# Patient Record
Sex: Female | Born: 1980 | Race: White | Hispanic: No | Marital: Single | Smoking: Never smoker
Health system: Southern US, Community
[De-identification: ages and names within clinical notes are randomized; demographics above are authoritative.]

## PROBLEM LIST (undated history)

## (undated) DIAGNOSIS — E7800 Pure hypercholesterolemia, unspecified: Secondary | ICD-10-CM

## (undated) DIAGNOSIS — F329 Major depressive disorder, single episode, unspecified: Secondary | ICD-10-CM

## (undated) DIAGNOSIS — R87629 Unspecified abnormal cytological findings in specimens from vagina: Secondary | ICD-10-CM

## (undated) DIAGNOSIS — B009 Herpesviral infection, unspecified: Secondary | ICD-10-CM

## (undated) DIAGNOSIS — F419 Anxiety disorder, unspecified: Secondary | ICD-10-CM

## (undated) DIAGNOSIS — I10 Essential (primary) hypertension: Secondary | ICD-10-CM

## (undated) HISTORY — DX: Anxiety disorder, unspecified: F41.9

## (undated) HISTORY — DX: Major depressive disorder, single episode, unspecified: F32.9

## (undated) HISTORY — DX: Unspecified abnormal cytological findings in specimens from vagina: R87.629

## (undated) HISTORY — PX: LEEP: SHX91

## (undated) HISTORY — DX: Herpesviral infection, unspecified: B00.9

## (undated) HISTORY — PX: WISDOM TOOTH EXTRACTION: SHX21

---

## 2001-05-09 ENCOUNTER — Encounter: Payer: Self-pay | Admitting: Family Medicine

## 2001-05-09 ENCOUNTER — Encounter: Admission: RE | Admit: 2001-05-09 | Discharge: 2001-05-09 | Payer: Self-pay | Admitting: Family Medicine

## 2001-05-16 ENCOUNTER — Other Ambulatory Visit: Admission: RE | Admit: 2001-05-16 | Discharge: 2001-05-16 | Payer: Self-pay | Admitting: Gynecology

## 2003-04-03 ENCOUNTER — Other Ambulatory Visit: Admission: RE | Admit: 2003-04-03 | Discharge: 2003-04-03 | Payer: Self-pay | Admitting: Gynecology

## 2020-01-04 ENCOUNTER — Other Ambulatory Visit: Payer: Self-pay

## 2020-01-04 ENCOUNTER — Encounter: Payer: Self-pay | Admitting: Emergency Medicine

## 2020-01-04 ENCOUNTER — Ambulatory Visit
Admission: EM | Admit: 2020-01-04 | Discharge: 2020-01-04 | Disposition: A | Discharge status: Home / Self Care | Payer: No Typology Code available for payment source | Attending: Emergency Medicine | Admitting: Emergency Medicine

## 2020-01-04 DIAGNOSIS — J029 Acute pharyngitis, unspecified: Secondary | ICD-10-CM | POA: Insufficient documentation

## 2020-01-04 DIAGNOSIS — J039 Acute tonsillitis, unspecified: Secondary | ICD-10-CM | POA: Insufficient documentation

## 2020-01-04 HISTORY — DX: Essential (primary) hypertension: I10

## 2020-01-04 LAB — POCT RAPID STREP A (OFFICE): Rapid Strep A Screen: NEGATIVE

## 2020-01-04 MED ORDER — CEPHALEXIN 500 MG PO CAPS: 500.0000 mg | ORAL_CAPSULE | Freq: Two times a day (BID) | ORAL | 0 refills | Status: AC

## 2020-01-04 NOTE — Discharge Instructions (Addendum)
Strep negative in office.  Culture sent.  We will call you with abnormal results.  Get rest and push fluids Keflex prescribed.  Take as directed and to completion Drink warm or cool liquids, use throat lozenges, or popsicles to help alleviate symptoms Take OTC ibuprofen or tylenol as needed for pain Follow up with PCP if symptoms persists Return or go to ER if patient has any new or worsening symptoms such as fever, chills, nausea, vomiting, worsening sore throat, cough, abdominal pain, chest pain, changes in bowel or bladder habits, etc..Marland Kitchen

## 2020-01-04 NOTE — ED Provider Notes (Signed)
[facility]   [id number] 01/04/20 Arrival Time: 1556  LE:XNTZ THROAT  SUBJECTIVE: History from: patient.  Carla Bailey is a 39 y.o. female who presents with sore throat and fatigue x 3 days.  Denies to sick exposure to strep, flu or mono, or precipitating event.  Has tried OTC medications without relief.  Symptoms are made worse with swallowing, but tolerating liquids and own secretions without difficulty.  Reports previous symptoms in the past that improved with antibiotic.   Denies fever, chills, ear pain, sinus pain, rhinorrhea, nasal congestion, cough, SOB, wheezing, chest pain, nausea, rash, changes in bowel or bladder habits.     ROS: As per HPI.  All other pertinent ROS negative.     Past Medical History:  Diagnosis Date  . Hypertension    History reviewed. No pertinent surgical history. No Known Allergies No current facility-administered medications on file prior to encounter.   Current Outpatient Medications on File Prior to Encounter  Medication Sig Dispense Refill  . amLODipine (NORVASC) 2.5 MG tablet Take 2.5 mg by mouth daily.    . hydrochlorothiazide (HYDRODIURIL) 50 MG tablet Take 50 mg by mouth daily.     Social History   Socioeconomic History  . Marital status: Single    Spouse name: Not on file  . Number of children: Not on file  . Years of education: Not on file  . Highest education level: Not on file  Occupational History  . Not on file  Tobacco Use  . Smoking status: Never Smoker  . Smokeless tobacco: Never Used  Substance and Sexual Activity  . Alcohol use: Yes    Comment: occasional  . Drug use: Not Currently  . Sexual activity: Not on file  Other Topics Concern  . Not on file  Social History Narrative  . Not on file   Social Determinants of Health   Financial Resource Strain:   . Difficulty of Paying Living Expenses: Not on file  Food Insecurity:   . Worried About Programme researcher, broadcasting/film/video in the Last Year: Not on file  . Ran  Out of Food in the Last Year: Not on file  Transportation Needs:   . Lack of Transportation (Medical): Not on file  . Lack of Transportation (Non-Medical): Not on file  Physical Activity:   . Days of Exercise per Week: Not on file  . Minutes of Exercise per Session: Not on file  Stress:   . Feeling of Stress : Not on file  Social Connections:   . Frequency of Communication with Friends and Family: Not on file  . Frequency of Social Gatherings with Friends and Family: Not on file  . Attends Religious Services: Not on file  . Active Member of Clubs or Organizations: Not on file  . Attends Banker Meetings: Not on file  . Marital Status: Not on file  Intimate Partner Violence:   . Fear of Current or Ex-Partner: Not on file  . Emotionally Abused: Not on file  . Physically Abused: Not on file  . Sexually Abused: Not on file   Family History  Problem Relation Age of Onset  . Hypertension Mother   . Stroke Mother   . Diabetes Father   . Hypertension Father     OBJECTIVE:  Vitals:   01/04/20 1617  BP: (!) 153/117  Pulse: (!) 107  Resp: 17  Temp: 98.7 F (37.1 C)  TempSrc: Oral  SpO2: 99%     General appearance: alert; appears  mildly fatigued, but nontoxic, speaking in full sentences and managing own secretions HEENT: NCAT; Ears: EACs clear, TMs pearly gray with visible cone of light, without erythema; Eyes: PERRL, EOMI grossly; Nose: no obvious rhinorrhea; Throat: oropharynx clear, tonsils 1+ and not erythematous with white tonsillar exudates, uvula midline Neck: supple without LAD Lungs: CTA bilaterally without adventitious breath sounds; cough absent Heart: regular rate and rhythm.   Skin: warm and dry Psychological: alert and cooperative; normal mood and affect  LABS: Results for orders placed or performed during the hospital encounter of 01/04/20 (from the past 24 hour(s))  POCT rapid strep A     Status: None   Collection Time: 01/04/20  4:26 PM  Result  Value Ref Range   Rapid Strep A Screen Negative Negative     ASSESSMENT & PLAN:  1. Acute tonsillitis, unspecified etiology   2. Sore throat     Meds ordered this encounter  Medications  . cephALEXin (KEFLEX) 500 MG capsule    Sig: Take 1 capsule (500 mg total) by mouth 2 (two) times daily for 10 days.    Dispense:  20 capsule    Refill:  0    Order Specific Question:   Supervising Provider    Answer:   Eustace Moore [[id number]]   Strep negative in office.  Culture sent.  We will call you with abnormal results.  Get rest and push fluids Keflex prescribed.  Take as directed and to completion Drink warm or cool liquids, use throat lozenges, or popsicles to help alleviate symptoms Take OTC ibuprofen or tylenol as needed for pain Follow up with PCP if symptoms persists Return or go to ER if patient has any new or worsening symptoms such as fever, chills, nausea, vomiting, worsening sore throat, cough, abdominal pain, chest pain, changes in bowel or bladder habits, etc...  Reviewed expectations re: course of current medical issues. Questions answered. Outlined signs and symptoms indicating need for more acute intervention. Patient verbalized understanding. After Visit Summary given.        Rennis Harding, PA-C 01/04/20 [phone]

## 2020-01-04 NOTE — ED Triage Notes (Signed)
Pt triaged and dc  by provider  

## 2020-01-08 LAB — CULTURE, GROUP A STREP (THRC)

## 2020-05-22 ENCOUNTER — Other Ambulatory Visit: Payer: Self-pay | Admitting: Nurse Practitioner

## 2020-08-14 ENCOUNTER — Other Ambulatory Visit: Payer: Self-pay | Admitting: Nurse Practitioner

## 2020-09-13 ENCOUNTER — Ambulatory Visit
Admission: EM | Admit: 2020-09-13 | Discharge: 2020-09-13 | Disposition: A | Discharge status: Home / Self Care | Payer: Commercial Managed Care - PPO | Attending: Emergency Medicine | Admitting: Emergency Medicine

## 2020-09-13 ENCOUNTER — Ambulatory Visit (INDEPENDENT_AMBULATORY_CARE_PROVIDER_SITE_OTHER): Payer: Commercial Managed Care - PPO

## 2020-09-13 ENCOUNTER — Other Ambulatory Visit: Payer: Self-pay

## 2020-09-13 DIAGNOSIS — R5383 Other fatigue: Secondary | ICD-10-CM | POA: Diagnosis not present

## 2020-09-13 DIAGNOSIS — R0981 Nasal congestion: Secondary | ICD-10-CM | POA: Diagnosis not present

## 2020-09-13 DIAGNOSIS — R52 Pain, unspecified: Secondary | ICD-10-CM

## 2020-09-13 DIAGNOSIS — J069 Acute upper respiratory infection, unspecified: Secondary | ICD-10-CM

## 2020-09-13 NOTE — Discharge Instructions (Signed)
Your EKG and chest xray look well today which is reassuring. I am checking your CBC as well to see if there are any concerning findings and to get a baseline white blood cell count.  Rest, increase your fluid intake.  If worsening- chest pain , shortness of breath , dizziness, or otherwise worsening please go to the ER for further evaluation. If you continue to have heart rate elevation with activity like this you will need further evaluation either with yoru PCP or with cardiology.

## 2020-09-13 NOTE — ED Triage Notes (Signed)
Pt presents with weakness and fatigue with nasal congestion for past week

## 2020-09-13 NOTE — ED Provider Notes (Signed)
[facility]    CSN: [id number] Arrival date & time: 09/13/20  0841      History   Chief Complaint Chief Complaint  Patient presents with  . Nasal Congestion    HPI Carla Bailey is a 40 y.o. female.   Carla Bailey presents with complaints of fatigue and congestion which started 8 days ago. She used mucinex all last week which did help some. Now with more fatigue, and she feels very diaphoretic with activity. No specific shortness of breath and infrequent cough. No calf or leg pain. No chest pain . No headache or body aches. She states she feels that her extremities feel "uncomfortable" and weak. 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 She did a home covid test day of onset of symptoms which was negative. Has not tested since. She has not had previous covid-19 infection. No gi symptoms. No known ill contacts.    ROS per HPI, negative if not otherwise mentioned.      Past Medical History:  Diagnosis Date  . Hypertension     There are no problems to display for this patient.   History reviewed. No pertinent surgical history.  OB History   No obstetric history on file.      Home Medications    Prior to Admission medications   Medication Sig Start Date End Date Taking? Authorizing Provider  amLODipine (NORVASC) 2.5 MG tablet Take 2.5 mg by mouth daily.    [provider]  hydrochlorothiazide (HYDRODIURIL) 50 MG tablet Take 50 mg by mouth daily.    [provider]  NUVARING 0.12-0.015 MG/24HR vaginal ring INSERT 1 RING VAGINALLY FOR 3 WEEKS THEN REMOVE FOR 1 WEEK, START ON OR BEFORE DAY 5 OF CYCLE 05/24/20   Heather Roberts, NP    Family History Family History  Problem Relation Age of Onset  . Hypertension Mother   . Stroke Mother   . Diabetes Father   . Hypertension Father     Social History Social History    Tobacco Use  . Smoking status: Never Smoker  . Smokeless tobacco: Never Used  Substance Use Topics  . Alcohol use: Yes    Comment: occasional  . Drug use: Not Currently     Allergies   Patient has no known allergies.   Review of Systems Review of Systems   Physical Exam Triage Vital Signs ED Triage Vitals  Enc Vitals Group     BP 09/13/20 0916 129/83     Pulse Rate 09/13/20 0916 (!) 122     Resp 09/13/20 0916 20     Temp 09/13/20 0916 97.9 F (36.6 C)     Temp src --      SpO2 09/13/20 0916 97 %     Weight --      Height --      Head Circumference --      Peak Flow --      Pain Score 09/13/20 0917 0     Pain Loc --      Pain Edu? --      Excl. in GC? --    No data found.  Updated Vital Signs BP 129/83   Pulse (!) 122   Temp 97.9 F (36.6 C)   Resp 20   LMP 08/16/2020   SpO2 97%   Visual Acuity Right Eye Distance:  Left Eye Distance:   Bilateral Distance:    Right Eye Near:   Left Eye Near:    Bilateral Near:     Physical Exam Constitutional:      General: She is not in acute distress.    Appearance: She is well-developed.  HENT:     Head: Normocephalic and atraumatic.     Right Ear: Tympanic membrane, ear canal and external ear normal.     Left Ear: Tympanic membrane, ear canal and external ear normal.     Nose: Nose normal.     Mouth/Throat:     Pharynx: Uvula midline.     Tonsils: No tonsillar exudate.  Eyes:     Conjunctiva/sclera: Conjunctivae normal.     Pupils: Pupils are equal, round, and reactive to light.  Cardiovascular:     Rate and Rhythm: Normal rate and regular rhythm.     Heart sounds: Normal heart sounds.  Pulmonary:     Effort: Pulmonary effort is normal.     Breath sounds: Normal breath sounds.  Skin:    General: Skin is warm and dry.  Neurological:     Mental Status: She is alert and oriented to person, place, and time.    EKG:  NSR rate of 76 . Previous EKG was not available for review. No stwave changes as  interpreted by me.    UC Treatments / Results  Labs (all labs ordered are listed, but only abnormal results are displayed) Labs Reviewed  CBC WITH DIFFERENTIAL/PLATELET    EKG   Radiology DG Chest 2 View  Result Date: 09/13/2020 CLINICAL DATA:  Fatiguing and congestion with body aches EXAM: CHEST - 2 VIEW COMPARISON:  None. FINDINGS: Normal heart size and mediastinal contours. No acute infiltrate or edema. No effusion or pneumothorax. No acute osseous findings. IMPRESSION: Negative chest. Electronically Signed   By: Marnee Spring M.D.   On: 09/13/2020 10:14    Procedures Procedures (including critical care time)  Medications Ordered in UC Medications - No data to display  Initial Impression / Assessment and Plan / UC Course  I have reviewed the triage vital signs and the nursing notes.  Pertinent labs & imaging results that were available during my care of the patient were reviewed by me and considered in my medical decision making (see chart for details).     Tachycardia initially with complaints of diaphoresis with activity. No chest pain. Tachycardia improves with rest. No hypoxia. URI symptoms. No obvious dehydration. Cbc collected and pending. Non toxic appearing and chest xray stable here today. pe considered. Again, no pain, no shortness of breath , no hypoxia and tachycardia does not persist. Continue with supportive cares. Return precautions provided. Patient verbalized understanding and agreeable to plan.   Final Clinical Impressions(s) / UC Diagnoses   Final diagnoses:  Acute upper respiratory infection  Fatigue, unspecified type     Discharge Instructions     Your EKG and chest xray look well today which is reassuring. I am checking your CBC as well to see if there are any concerning findings and to get a baseline white blood cell count.  Rest, increase your fluid intake.  If worsening- chest pain , shortness of breath , dizziness, or otherwise worsening  please go to the ER for further evaluation. If you continue to have heart rate elevation with activity like this you will need further evaluation either with yoru PCP or with cardiology.     ED Prescriptions    None  PDMP not reviewed this encounter.   Georgetta Haber, NP 09/13/20 [phone]

## 2020-09-14 LAB — CBC WITH DIFFERENTIAL/PLATELET
Basophils Absolute: 0.1 10*3/uL (ref 0.0–0.2)
Basos: 0 %
EOS (ABSOLUTE): 0.1 10*3/uL (ref 0.0–0.4)
Eos: 1 %
Hematocrit: 44.8 % (ref 34.0–46.6)
Hemoglobin: 14.6 g/dL (ref 11.1–15.9)
Immature Grans (Abs): 0 10*3/uL (ref 0.0–0.1)
Immature Granulocytes: 0 %
Lymphocytes Absolute: 3.1 10*3/uL (ref 0.7–3.1)
Lymphs: 21 %
MCH: 29.9 pg (ref 26.6–33.0)
MCHC: 32.6 g/dL (ref 31.5–35.7)
MCV: 92 fL (ref 79–97)
Monocytes Absolute: 0.9 10*3/uL (ref 0.1–0.9)
Monocytes: 6 %
Neutrophils Absolute: 10.9 10*3/uL — ABNORMAL HIGH (ref 1.4–7.0)
Neutrophils: 72 %
Platelets: 397 10*3/uL (ref 150–450)
RBC: 4.89 x10E6/uL (ref 3.77–5.28)
RDW: 11.8 % (ref 11.7–15.4)
WBC: 15.1 10*3/uL — ABNORMAL HIGH (ref 3.4–10.8)

## 2021-07-07 ENCOUNTER — Ambulatory Visit
Admission: EM | Admit: 2021-07-07 | Discharge: 2021-07-07 | Disposition: A | Discharge status: Home / Self Care | Payer: Commercial Managed Care - PPO

## 2021-07-07 ENCOUNTER — Other Ambulatory Visit: Payer: Self-pay

## 2021-07-07 DIAGNOSIS — R070 Pain in throat: Secondary | ICD-10-CM

## 2021-07-07 DIAGNOSIS — J309 Allergic rhinitis, unspecified: Secondary | ICD-10-CM

## 2021-07-07 DIAGNOSIS — R051 Acute cough: Secondary | ICD-10-CM

## 2021-07-07 DIAGNOSIS — H9203 Otalgia, bilateral: Secondary | ICD-10-CM | POA: Diagnosis not present

## 2021-07-07 DIAGNOSIS — J019 Acute sinusitis, unspecified: Secondary | ICD-10-CM | POA: Diagnosis not present

## 2021-07-07 DIAGNOSIS — R0981 Nasal congestion: Secondary | ICD-10-CM

## 2021-07-07 MED ORDER — BENZONATATE 100 MG PO CAPS: 100.0000 mg | ORAL_CAPSULE | Freq: Three times a day (TID) | ORAL | 0 refills | Status: DC | PRN

## 2021-07-07 MED ORDER — PROMETHAZINE-DM 6.25-15 MG/5ML PO SYRP: 5.0000 mL | ORAL_SOLUTION | Freq: Every evening | ORAL | 0 refills | Status: DC | PRN

## 2021-07-07 MED ORDER — LEVOCETIRIZINE DIHYDROCHLORIDE 5 MG PO TABS: 5.0000 mg | ORAL_TABLET | Freq: Every evening | ORAL | 0 refills | Status: DC

## 2021-07-07 MED ORDER — AMOXICILLIN-POT CLAVULANATE 875-125 MG PO TABS: 1.0000 | ORAL_TABLET | Freq: Two times a day (BID) | ORAL | 0 refills | Status: DC

## 2021-07-07 MED ORDER — PSEUDOEPHEDRINE HCL 60 MG PO TABS: 60.0000 mg | ORAL_TABLET | Freq: Three times a day (TID) | ORAL | 0 refills | Status: DC | PRN

## 2021-07-07 NOTE — ED Provider Notes (Signed)
?[facility] ? ? ?MRN: [id number] DOB: 08-Feb-1981 ? ?Subjective:  ? ?Carla Bailey is a 41 y.o. female presenting for 5-day history of acute onset persistent and worsening sinus congestion, sinus pressure, hoarseness, bilateral ear pain, coughing, throat congestion, chest congestion, shortness of breath from the coughing.  No history of asthma.  Patient is not a smoker.  She is done 2 COVID test and has been negative.  She believes she had flu 2 weeks ago and had a recovery prior to getting ill this past week. ? ?No current facility-administered medications for this encounter. ? ?Current Outpatient Medications:  ?  acyclovir (ZOVIRAX) 800 MG tablet, Take 800 mg by mouth daily., Disp: , Rfl:  ?  amLODipine (NORVASC) 2.5 MG tablet, Take 2.5 mg by mouth daily., Disp: , Rfl:  ?  hydrochlorothiazide (HYDRODIURIL) 50 MG tablet, Take 50 mg by mouth daily., Disp: , Rfl:  ?  NUVARING 0.12-0.015 MG/24HR vaginal ring, INSERT 1 RING VAGINALLY FOR 3 WEEKS THEN REMOVE FOR 1 WEEK, START ON OR BEFORE DAY 5 OF CYCLE, Disp: 1 each, Rfl: 1  ? ?No Known Allergies ? ?Past Medical History:  ?Diagnosis Date  ? Hypertension   ?  ? ?History reviewed. No pertinent surgical history. ? ?Family History  ?Problem Relation Age of Onset  ? Hypertension Mother   ? Stroke Mother   ? Diabetes Father   ? Hypertension Father   ? ? ?Social History  ? ?Tobacco Use  ? Smoking status: Never  ? Smokeless tobacco: Never  ?Substance Use Topics  ? Alcohol use: Yes  ?  Comment: occasional  ? Drug use: Not Currently  ? ? ?ROS ? ? ?Objective:  ? ?Vitals: ?BP 131/86   Pulse 93   Temp 97.7 ?F (36.5 ?C)   Resp 18   SpO2 96%  ? ?Physical Exam ?Constitutional:   ?   General: She is not in acute distress. ?   Appearance: Normal appearance. She is well-developed and normal weight. She is not ill-appearing, toxic-appearing or diaphoretic.  ?HENT:  ?   Head: Normocephalic and atraumatic.  ?   Right Ear: Ear canal and external ear normal. No  drainage or tenderness. No middle ear effusion. There is no impacted cerumen. Tympanic membrane is not erythematous.  ?   Left Ear: Ear canal and external ear normal. No drainage or tenderness.  No middle ear effusion. There is no impacted cerumen. Tympanic membrane is not erythematous.  ?   Ears:  ?   Comments: TMs opacified bilaterally. ?   Nose: Congestion and rhinorrhea present.  ?   Mouth/Throat:  ?   Mouth: Mucous membranes are moist. No oral lesions.  ?   Pharynx: No pharyngeal swelling, oropharyngeal exudate, posterior oropharyngeal erythema or uvula swelling.  ?   Tonsils: No tonsillar exudate or tonsillar abscesses.  ?   Comments: Hoarseness of voice noted. ?Eyes:  ?   General: No scleral icterus.    ?   Right eye: No discharge.     ?   Left eye: No discharge.  ?   Extraocular Movements: Extraocular movements intact.  ?   Right eye: Normal extraocular motion.  ?   Left eye: Normal extraocular motion.  ?   Conjunctiva/sclera: Conjunctivae normal.  ?Cardiovascular:  ?   Rate and Rhythm: Normal rate.  ?   Heart sounds: No murmur heard. ?  No friction rub. No gallop.  ?Pulmonary:  ?   Effort: Pulmonary effort is normal. No respiratory distress.  ?  Breath sounds: No stridor. No wheezing, rhonchi or rales.  ?Chest:  ?   Chest wall: No tenderness.  ?Musculoskeletal:  ?   Cervical back: Normal range of motion and neck supple.  ?Lymphadenopathy:  ?   Cervical: No cervical adenopathy.  ?Skin: ?   General: Skin is warm and dry.  ?Neurological:  ?   General: No focal deficit present.  ?   Mental Status: She is alert and oriented to person, place, and time.  ?Psychiatric:     ?   Mood and Affect: Mood normal.     ?   Behavior: Behavior normal.  ? ? ?Assessment and Plan :  ? ?PDMP not reviewed this encounter. ? ?1. Acute non-recurrent sinusitis, unspecified location   ?2. Sinus congestion   ?3. Acute cough   ?4. Acute otalgia, bilateral   ?5. Throat pain   ? ?Given timeline of illness, deferred COVID testing. Deferred  imaging given clear cardiopulmonary exam, hemodynamically stable vital signs. Will start empiric treatment for sinusitis with Augmentin.  Recommended supportive care otherwise including the use of oral antihistamine, decongestant especially since she has allergies. Counseled patient on potential for adverse effects with medications prescribed/recommended today, ER and return-to-clinic precautions discussed, patient verbalized understanding. ? ?  ?Wallis Bamberg, PA-C ?07/07/21 1007 ? ?

## 2021-07-07 NOTE — ED Triage Notes (Signed)
Pt presents with cough for past week and also has bilateral ear pain  ?

## 2021-09-10 ENCOUNTER — Encounter: Payer: Self-pay | Admitting: Emergency Medicine

## 2021-09-10 ENCOUNTER — Ambulatory Visit (INDEPENDENT_AMBULATORY_CARE_PROVIDER_SITE_OTHER): Payer: Commercial Managed Care - PPO

## 2021-09-10 ENCOUNTER — Ambulatory Visit
Admission: EM | Admit: 2021-09-10 | Discharge: 2021-09-10 | Disposition: A | Discharge status: Home / Self Care | Payer: Commercial Managed Care - PPO

## 2021-09-10 DIAGNOSIS — M25571 Pain in right ankle and joints of right foot: Secondary | ICD-10-CM | POA: Diagnosis not present

## 2021-09-10 HISTORY — DX: Pure hypercholesterolemia, unspecified: E78.00

## 2021-09-10 NOTE — ED Provider Notes (Signed)
?[facility] ? ? ? ?CSN: [id number] ?Arrival date & time: 09/10/21  1405 ? ? ?  ? ?History   ?Chief Complaint ?No chief complaint on file. ? ?HPI ?Carla Bailey is a 41 y.o. female.  ? ?Presenting today with 1 day history of right lateral ankle pain.  She states she was sitting with her leg underneath her in a chair this morning and something that was a sharp within the chair stabbed her in the lateral ankle.  She states the pain was so severe that it caused her to feel like she was going to pass out.  She took 2 Advil and states she has been feeling a lot better since then but still having some soreness in the area, worse with movement.  Denies swelling, bruising, bleeding, numbness, tingling, weakness. ? ?Past Medical History:  ?Diagnosis Date  ? High cholesterol   ? Hypertension   ? ? ?There are no problems to display for this patient. ? ? ?History reviewed. No pertinent surgical history. ? ?OB History   ?No obstetric history on file. ?  ? ? ? ?Home Medications   ? ?Prior to Admission medications   ?Medication Sig Start Date End Date Taking? Authorizing Provider  ?atorvastatin (LIPITOR) 10 MG tablet Take 10 mg by mouth daily.   Yes [provider]  ?escitalopram (LEXAPRO) 10 MG tablet Take 10 mg by mouth daily.   Yes [provider]  ?eszopiclone (LUNESTA) 1 MG TABS tablet Take 1 mg by mouth at bedtime as needed for sleep. Take immediately before bedtime   Yes [provider]  ?acyclovir (ZOVIRAX) 800 MG tablet Take 800 mg by mouth daily. 04/06/21   [provider]  ?amLODipine (NORVASC) 2.5 MG tablet Take 2.5 mg by mouth daily.    [provider]  ?amoxicillin-clavulanate (AUGMENTIN) 875-125 MG tablet Take 1 tablet by mouth 2 (two) times daily. 07/07/21   Wallis BambergMani, Mario, PA-C  ?benzonatate (TESSALON) 100 MG capsule Take 1-2 capsules (100-200 mg total) by mouth 3 (three) times daily as needed for cough. 07/07/21   Wallis BambergMani, Mario, PA-C  ?hydrochlorothiazide  (HYDRODIURIL) 50 MG tablet Take 50 mg by mouth daily.    [provider]  ?levocetirizine (XYZAL) 5 MG tablet Take 1 tablet (5 mg total) by mouth every evening. 07/07/21   Wallis BambergMani, Mario, PA-C  ?NUVARING 0.12-0.015 MG/24HR vaginal ring INSERT 1 RING VAGINALLY FOR 3 WEEKS THEN REMOVE FOR 1 WEEK, START ON OR BEFORE DAY 5 OF CYCLE 05/24/20   Heather RobertsGray, Joseph M, NP  ?promethazine-dextromethorphan (PROMETHAZINE-DM) 6.25-15 MG/5ML syrup Take 5 mLs by mouth at bedtime as needed for cough. 07/07/21   Wallis BambergMani, Mario, PA-C  ?pseudoephedrine (SUDAFED) 60 MG tablet Take 1 tablet (60 mg total) by mouth every 8 (eight) hours as needed for congestion. 07/07/21   Wallis BambergMani, Mario, PA-C  ? ?Family History ?Family History  ?Problem Relation Age of Onset  ? Hypertension Mother   ? Stroke Mother   ? Diabetes Father   ? Hypertension Father   ? ?Social History ?Social History  ? ?Tobacco Use  ? Smoking status: Never  ? Smokeless tobacco: Never  ?Substance Use Topics  ? Alcohol use: Yes  ?  Comment: occasional  ? Drug use: Not Currently  ? ? ? ?Allergies   ?Patient has no known allergies. ? ? ?Review of Systems ?Review of Systems ?Per HPI ? ?Physical Exam ?Triage Vital Signs ?ED Triage Vitals  ?Enc Vitals Group  ?   BP 09/10/21 1412 134/80  ?  Pulse Rate 09/10/21 1412 (!) 108  ?   Resp 09/10/21 1412 18  ?   Temp 09/10/21 1412 98 ?F (36.7 ?C)  ?   Temp Source 09/10/21 1412 Oral  ?   SpO2 09/10/21 1412 95 %  ?   Weight --   ?   Height --   ?   Head Circumference --   ?   Peak Flow --   ?   Pain Score 09/10/21 1414 4  ?   Pain Loc --   ?   Pain Edu? --   ?   Excl. in GC? --   ? ?No data found. ? ?Updated Vital Signs ?BP 134/80 (BP Location: Right Arm)   Pulse (!) 108   Temp 98 ?F (36.7 ?C) (Oral)   Resp 18   SpO2 95%  ? ?Visual Acuity ?Right Eye Distance:   ?Left Eye Distance:   ?Bilateral Distance:   ? ?Right Eye Near:   ?Left Eye Near:    ?Bilateral Near:    ? ?Physical Exam ?Vitals and nursing note reviewed.  ?Constitutional:   ?   Appearance:  Normal appearance. She is not ill-appearing.  ?HENT:  ?   Head: Atraumatic.  ?Eyes:  ?   Extraocular Movements: Extraocular movements intact.  ?   Conjunctiva/sclera: Conjunctivae normal.  ?Cardiovascular:  ?   Rate and Rhythm: Normal rate and regular rhythm.  ?   Heart sounds: Normal heart sounds.  ?Pulmonary:  ?   Effort: Pulmonary effort is normal.  ?   Breath sounds: Normal breath sounds.  ?Musculoskeletal:     ?   General: Tenderness and signs of injury present. No swelling or deformity. Normal range of motion.  ?   Cervical back: Normal range of motion and neck supple.  ?   Comments: Tenderness to palpation right lateral malleolus, range of motion fully intact  ?Skin: ?   General: Skin is warm and dry.  ?   Comments: Very small area of erythema, pinpoint superficial marking from object in chair that poked her.  No diffuse edema, bruising  ?Neurological:  ?   Mental Status: She is alert and oriented to person, place, and time.  ?   Comments: Right lower extremity neurovascularly intact  ?Psychiatric:     ?   Mood and Affect: Mood normal.     ?   Thought Content: Thought content normal.     ?   Judgment: Judgment normal.  ? ? ? ?UC Treatments / Results  ?Labs ?(all labs ordered are listed, but only abnormal results are displayed) ?Labs Reviewed - No data to display ? ?EKG ? ? ?Radiology ?DG Ankle Complete Right ? ?Result Date: 09/10/2021 ?CLINICAL DATA:  Larey Seat.  Right ankle pain and swelling. EXAM: RIGHT ANKLE - COMPLETE 3+ VIEW COMPARISON:  None Available. FINDINGS: The ankle mortise is maintained. No acute ankle fracture. No osteochondral lesion. No ankle joint effusion. The mid and hindfoot bony structures are intact. IMPRESSION: No acute bony findings. Electronically Signed   By: Rudie Meyer M.D.   On: 09/10/2021 14:34   ? ?Procedures ?Procedures (including critical care time) ? ?Medications Ordered in UC ?Medications - No data to display ? ?Initial Impression / Assessment and Plan / UC Course  ?I have  reviewed the triage vital signs and the nursing notes. ? ?Pertinent labs & imaging results that were available during my care of the patient were reviewed by me and considered in my medical decision making (see chart  for details). ? ?  ? ?X-ray negative for acute bony abnormality.  Ace wrap applied for comfort, RICE protocol, over-the-counter pain relievers reviewed.  Return for worsening symptoms.  Work note given. ? ?Final Clinical Impressions(s) / UC Diagnoses  ? ?Final diagnoses:  ?Pain in joint involving right ankle and foot  ? ?Discharge Instructions   ?None ?  ? ?ED Prescriptions   ?None ?  ? ?PDMP not reviewed this encounter. ?  ?Particia Nearing, PA-C ?09/10/21 1505 ? ?

## 2021-09-10 NOTE — ED Triage Notes (Signed)
Fell in chair on right ankle, pain and swelling to right ankle ?

## 2021-11-28 IMAGING — DX DG CHEST 2V
2 series · 2 of 2 positions shown · non-contrast
Comparison: None.

CLINICAL DATA: Fatiguing and congestion with body aches

EXAM:
CHEST - 2 VIEW

[chest pa]
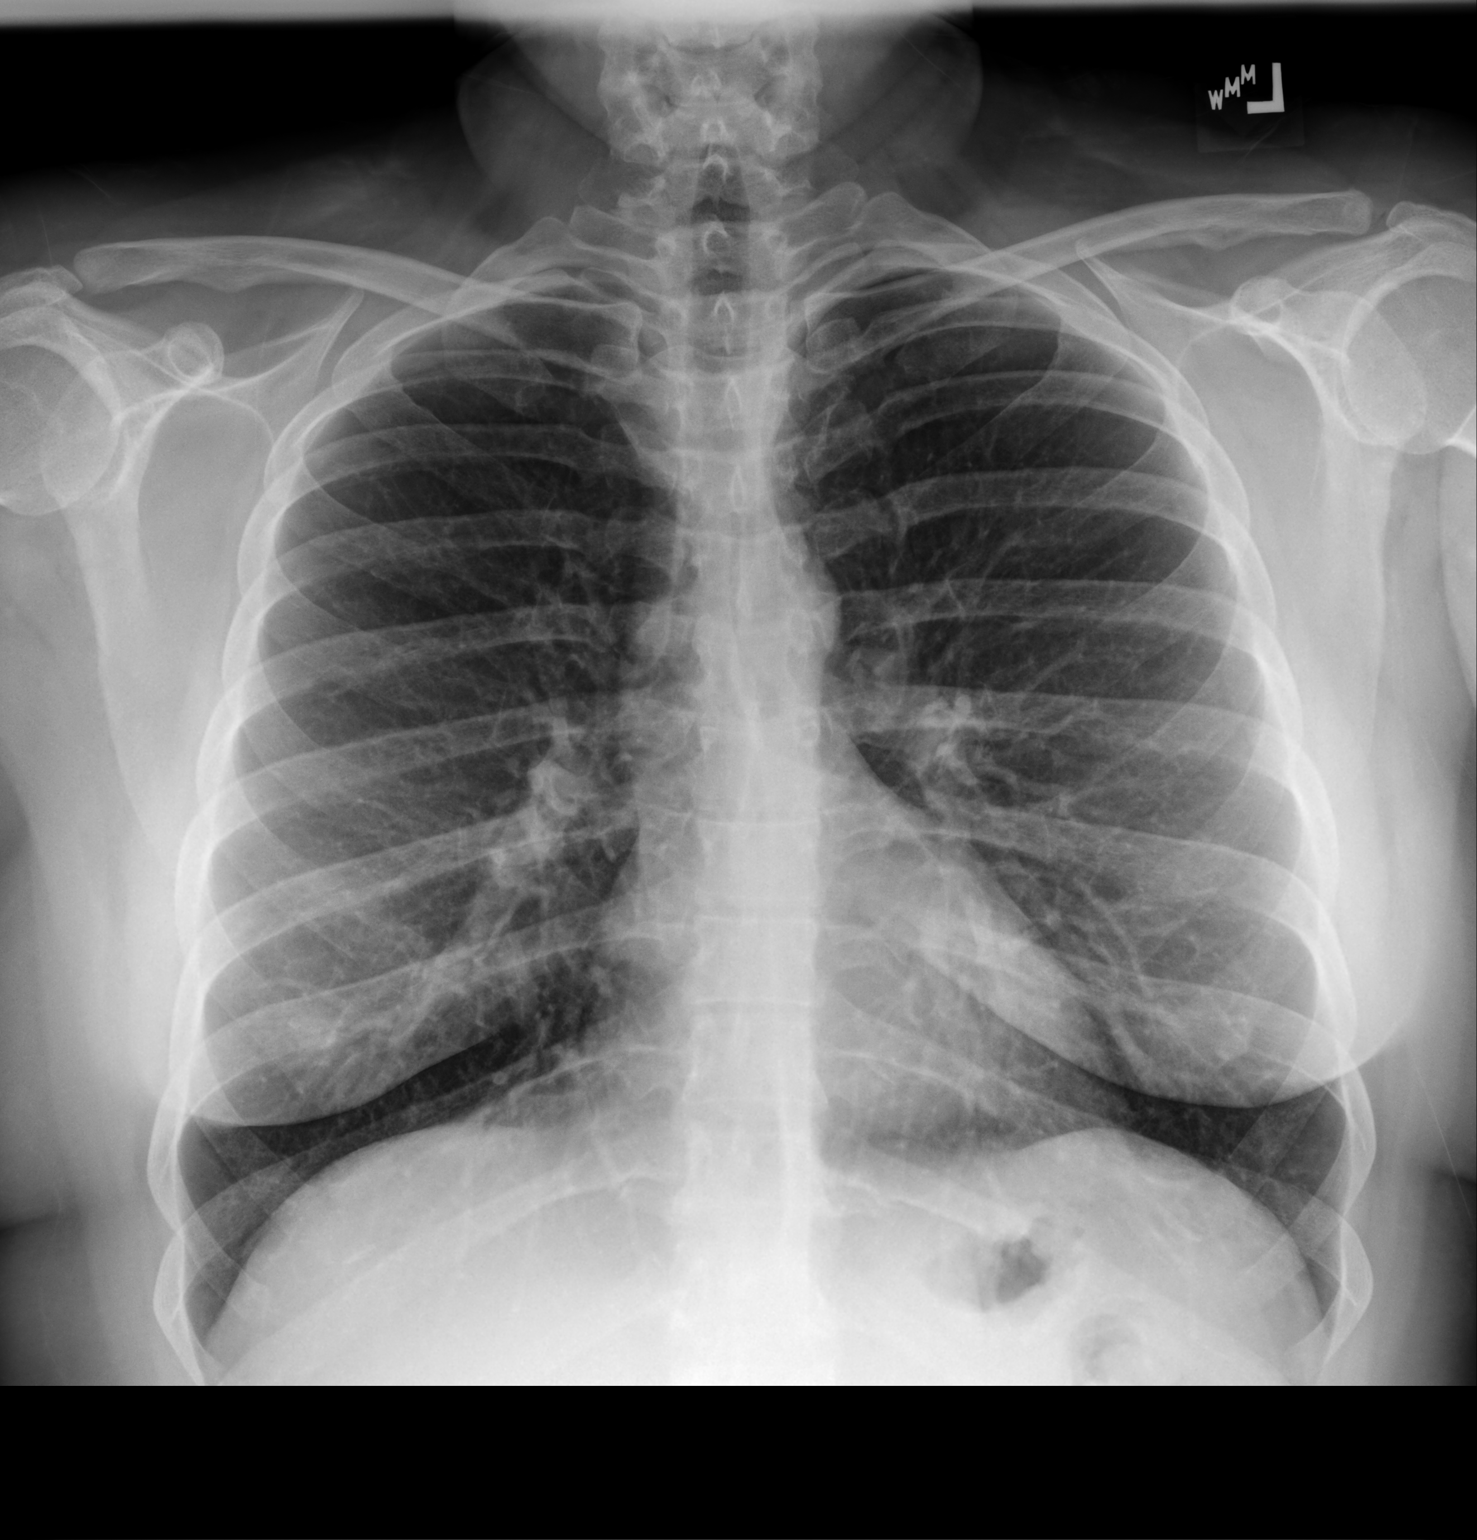

[chest lat]
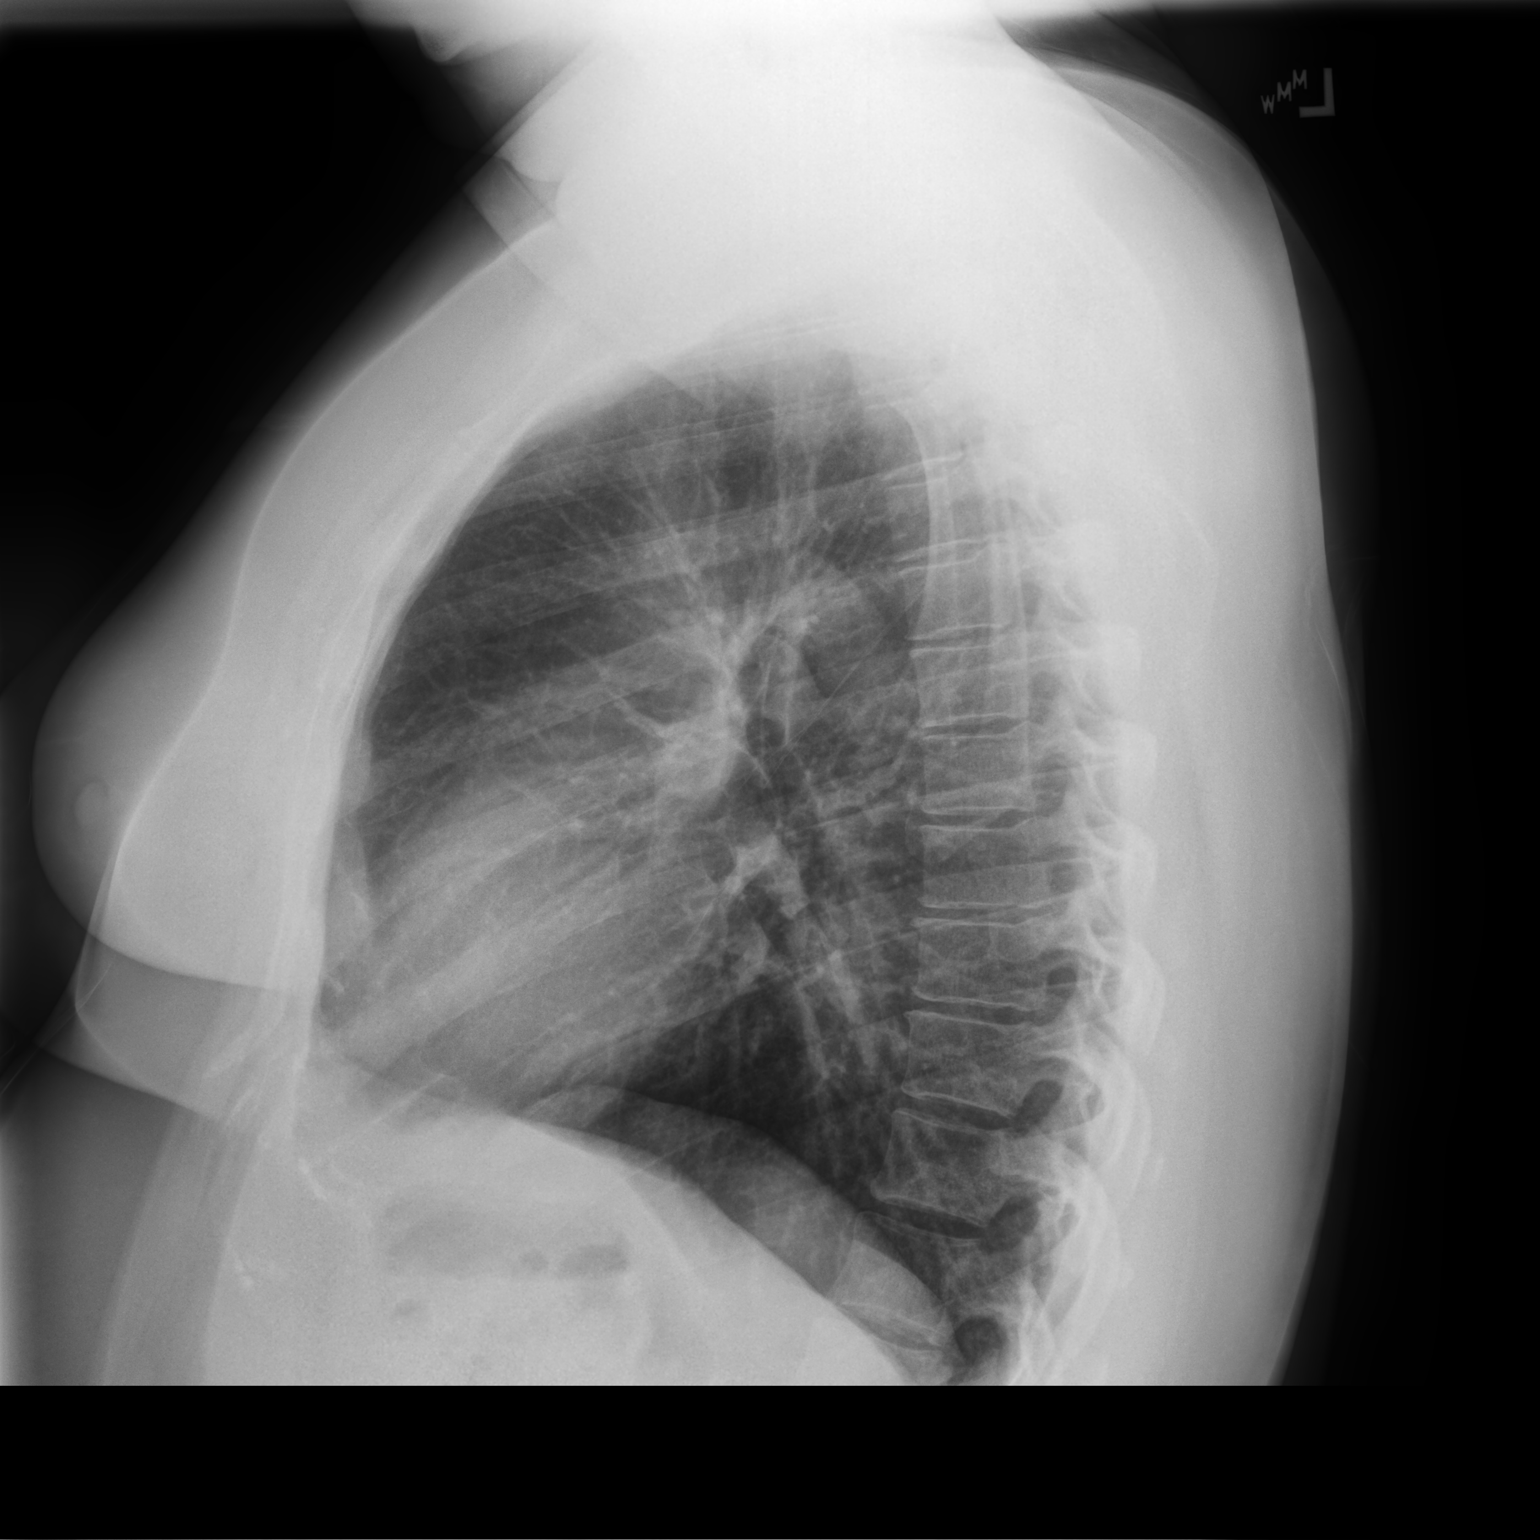

[2 of 2 positions shown; findings below may reference images not displayed]

FINDINGS: Normal heart size and mediastinal contours. No acute infiltrate or
edema. No effusion or pneumothorax. No acute osseous findings.
IMPRESSION: Negative chest.

## 2022-05-10 ENCOUNTER — Other Ambulatory Visit (HOSPITAL_COMMUNITY): Payer: Self-pay | Admitting: Internal Medicine

## 2022-05-10 DIAGNOSIS — Z1231 Encounter for screening mammogram for malignant neoplasm of breast: Secondary | ICD-10-CM

## 2022-05-26 ENCOUNTER — Encounter (HOSPITAL_COMMUNITY): Payer: Self-pay

## 2022-05-26 ENCOUNTER — Ambulatory Visit (HOSPITAL_COMMUNITY)
Admission: RE | Admit: 2022-05-26 | Discharge: 2022-05-26 | Disposition: A | Discharge status: Home / Self Care | Payer: Commercial Managed Care - PPO | Source: Ambulatory Visit | Attending: Internal Medicine | Admitting: Internal Medicine

## 2022-05-26 DIAGNOSIS — Z1231 Encounter for screening mammogram for malignant neoplasm of breast: Secondary | ICD-10-CM

## 2022-11-07 ENCOUNTER — Ambulatory Visit: Payer: Commercial Managed Care - PPO | Admitting: Obstetrics & Gynecology

## 2022-11-07 ENCOUNTER — Encounter: Payer: Self-pay | Admitting: Obstetrics & Gynecology

## 2022-11-07 VITALS — Ht 64.0 in

## 2022-11-07 DIAGNOSIS — N870 Mild cervical dysplasia: Secondary | ICD-10-CM

## 2022-11-07 MED ORDER — VEOZAH 45 MG PO TABS: 1.0000 | ORAL_TABLET | Freq: Every day | ORAL | 11 refills | Status: DC

## 2022-11-07 NOTE — Progress Notes (Signed)
    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 New sexual partner:  No.    History of abnormal Pap: yes years and years ago  Procedure Details  The risks and benefits of the procedure and Written informed consent obtained.  Speculum placed in vagina and excellent visualization of cervix achieved, cervix swabbed x 3 with acetic acid solution.  Findings: Adequate colposcopy is noted today.  Cervix: no visible lesions, no mosaicism, no punctation, and no abnormal vasculature; SCJ visualized 360 degrees without lesions and no biopsies taken. Vaginal inspection: normal without visible lesions. Vulvar colposcopy: vulvar colposcopy not performed.  Specimens: none  Complications: none.  Colposcopic Impression: Normal colposcopy  Plan(Based on 2019 ASCCP recommendations) Repeat HPV based cytology 1 year

## 2023-05-23 ENCOUNTER — Other Ambulatory Visit (HOSPITAL_COMMUNITY): Payer: Self-pay | Admitting: Internal Medicine

## 2023-05-23 DIAGNOSIS — Z1231 Encounter for screening mammogram for malignant neoplasm of breast: Secondary | ICD-10-CM

## 2023-06-08 ENCOUNTER — Ambulatory Visit (HOSPITAL_COMMUNITY)
Admission: RE | Admit: 2023-06-08 | Discharge: 2023-06-08 | Disposition: A | Discharge status: Home / Self Care | Payer: PRIVATE HEALTH INSURANCE | Source: Ambulatory Visit | Attending: Internal Medicine | Admitting: Internal Medicine

## 2023-06-08 DIAGNOSIS — Z1231 Encounter for screening mammogram for malignant neoplasm of breast: Secondary | ICD-10-CM | POA: Insufficient documentation

## 2024-01-19 LAB — CYTOLOGY - PAP

## 2024-02-08 ENCOUNTER — Telehealth: Payer: Self-pay | Admitting: *Deleted

## 2024-02-08 NOTE — Telephone Encounter (Signed)
 Made patient aware that we have contacted [facility] to add subtypes 16/18/45 to her pap to determine if she needs colpo or repeat pap in 1 year. Pt verbalized understanding with no further questions.

## 2024-05-08 ENCOUNTER — Encounter: Payer: Self-pay | Admitting: Emergency Medicine

## 2024-05-08 ENCOUNTER — Ambulatory Visit
Admission: EM | Admit: 2024-05-08 | Discharge: 2024-05-08 | Disposition: A | Discharge status: Home / Self Care | Attending: Nurse Practitioner | Admitting: Nurse Practitioner

## 2024-05-08 DIAGNOSIS — H9202 Otalgia, left ear: Secondary | ICD-10-CM | POA: Insufficient documentation

## 2024-05-08 DIAGNOSIS — J029 Acute pharyngitis, unspecified: Secondary | ICD-10-CM | POA: Diagnosis present

## 2024-05-08 LAB — POCT RAPID STREP A (OFFICE): Rapid Strep A Screen: NEGATIVE

## 2024-05-08 NOTE — Discharge Instructions (Signed)
 The rapid strep test was negative.  A throat culture is pending.  You will be contacted if the pending test result is abnormal.  You will also have access to the results via MyChart. Increase fluids and allow for plenty of rest. You may take over-the-counter Tylenol or ibuprofen as needed for pain, fever, or general discomfort. You may also use warm salt water gargles 3-4 times daily as needed or Chloraseptic throat spray or throat lozenges while symptoms persist. Also recommend the use of over-the-counter antihistamine and nasal spray to help with postnasal drainage such as Zyrtec, Claritin, or Allegra.  You may also purchase over-the-counter Flonase nasal spray as needed. Continue to monitor for worsening symptoms.  Follow-up if you develop fever, chills, or other upper respiratory symptoms. Follow-up as needed.

## 2024-05-08 NOTE — ED Provider Notes (Signed)
 " [facility]    CSN: [id number] Arrival date & time: 05/08/24  1740      History   Chief Complaint No chief complaint on file.   HPI Carla Bailey is a 44 y.o. female.   The history is provided by the patient.   Patient presents for a 3-day history of left ear pain, and sore throat.  She denies fever, chills, headache, sore throat, ear drainage, abdominal pain, nausea, vomiting, diarrhea, or rash.  Patient also states that she feels a heaviness in her chest.  States that she has not had a cough.  States that she would like to be evaluated to see if she is coming down with anything.  So far, she has not taken any medications for her symptoms.  Past Medical History:  Diagnosis Date   Anxiety    Herpes simplex virus (HSV) infection    High cholesterol    Hypertension    Major depressive disorder    Vaginal Pap smear, abnormal     There are no active problems to display for this patient.   Past Surgical History:  Procedure Laterality Date   LEEP     WISDOM TOOTH EXTRACTION      OB History     Gravida  0   Para  0   Term  0   Preterm  0   AB  0   Living  0      SAB  0   IAB  0   Ectopic  0   Multiple  0   Live Births  0            Home Medications    Prior to Admission medications  Medication Sig Start Date End Date Taking? Authorizing Provider  propranolol (INDERAL) 10 MG tablet Take 10 mg by mouth as needed.   Yes [provider]  acyclovir (ZOVIRAX) 800 MG tablet Take 800 mg by mouth daily. 04/06/21   [provider]  amLODipine (NORVASC) 5 MG tablet Take 1 tablet by mouth daily. 02/15/22   [provider]  atorvastatin (LIPITOR) 10 MG tablet Take 10 mg by mouth daily.    [provider]  escitalopram (LEXAPRO) 10 MG tablet Take 10 mg by mouth daily.    [provider]  eszopiclone (LUNESTA) 1 MG TABS tablet Take 1 mg by mouth at bedtime as needed for sleep. Take immediately  before bedtime    [provider]  Fezolinetant  (VEOZAH ) 45 MG TABS Take 1 tablet (45 mg total) by mouth at bedtime. 11/07/22   Jayne Vonn DEL, MD  hydrochlorothiazide (HYDRODIURIL) 25 MG tablet Take 1 tablet by mouth daily. 03/03/19   [provider]  NUVARING 0.12-0.015 MG/24HR vaginal ring INSERT 1 RING VAGINALLY FOR 3 WEEKS THEN REMOVE FOR 1 WEEK, START ON OR BEFORE DAY 5 OF CYCLE 05/24/20   Elnor Fairy HERO, NP    Family History Family History  Problem Relation Age of Onset   Diabetes Father    Hypertension Father    Hypertension Mother    Stroke Mother    Hypertension Sister    Anxiety disorder Sister    High Cholesterol Sister    Breast cancer Neg Hx     Social History Social History[1]   Allergies   Augmentin  [amoxicillin -pot clavulanate] and Latex   Review of Systems Review of Systems Per HPI  Physical Exam Triage Vital Signs ED Triage Vitals  Encounter Vitals Group  BP 05/08/24 1825 129/68     Girls Systolic BP Percentile --      Girls Diastolic BP Percentile --      Boys Systolic BP Percentile --      Boys Diastolic BP Percentile --      Pulse Rate 05/08/24 1825 93     Resp 05/08/24 1825 18     Temp 05/08/24 1825 98.2 F (36.8 C)     Temp Source 05/08/24 1825 Oral     SpO2 05/08/24 1825 97 %     Weight --      Height --      Head Circumference --      Peak Flow --      Pain Score 05/08/24 1826 5     Pain Loc --      Pain Education --      Exclude from Growth Chart --    No data found.  Updated Vital Signs BP 129/68 (BP Location: Right Arm)   Pulse 93   Temp 98.2 F (36.8 C) (Oral)   Resp 18   LMP 04/23/2024 (Approximate)   SpO2 97%   Visual Acuity Right Eye Distance:   Left Eye Distance:   Bilateral Distance:    Right Eye Near:   Left Eye Near:    Bilateral Near:     Physical Exam Vitals and nursing note reviewed.  Constitutional:      General: She is not in acute distress.    Appearance: Normal appearance.   HENT:     Head: Normocephalic.     Right Ear: Tympanic membrane, ear canal and external ear normal.     Left Ear: Tympanic membrane, ear canal and external ear normal.     Nose: Nose normal.     Right Turbinates: Enlarged and swollen.     Left Turbinates: Enlarged and swollen.     Right Sinus: No maxillary sinus tenderness or frontal sinus tenderness.     Left Sinus: No maxillary sinus tenderness or frontal sinus tenderness.     Mouth/Throat:     Lips: Pink.     Mouth: Mucous membranes are moist.     Pharynx: Posterior oropharyngeal erythema and postnasal drip present. No pharyngeal swelling, oropharyngeal exudate or uvula swelling.     Comments: Cobblestoning present to posterior oropharynx  Eyes:     Extraocular Movements: Extraocular movements intact.     Conjunctiva/sclera: Conjunctivae normal.     Pupils: Pupils are equal, round, and reactive to light.  Cardiovascular:     Rate and Rhythm: Normal rate and regular rhythm.     Pulses: Normal pulses.     Heart sounds: Normal heart sounds.  Pulmonary:     Effort: Pulmonary effort is normal. No respiratory distress.     Breath sounds: Normal breath sounds. No stridor. No wheezing, rhonchi or rales.  Abdominal:     General: Bowel sounds are normal.     Palpations: Abdomen is soft.  Musculoskeletal:     Cervical back: Normal range of motion.  Skin:    General: Skin is warm and dry.  Neurological:     General: No focal deficit present.     Mental Status: She is alert and oriented to person, place, and time.  Psychiatric:        Mood and Affect: Mood normal.        Behavior: Behavior normal.      UC Treatments / Results  Labs (all labs ordered are listed, but only  abnormal results are displayed) Labs Reviewed  POCT RAPID STREP A (OFFICE) - Normal  CULTURE, GROUP A STREP [facility])    EKG   Radiology No results found.  Procedures Procedures (including critical care time)  Medications Ordered in UC Medications - No  data to display  Initial Impression / Assessment and Plan / UC Course  I have reviewed the triage vital signs and the nursing notes.  Pertinent labs & imaging results that were available during my care of the patient were reviewed by me and considered in my medical decision making (see chart for details).  On exam, the patient's lung sounds are clear throughout, room air sats are at 97%.  She is well-appearing, is in no acute distress, vital signs are stable.  The rapid strep test was negative, throat culture is pending.  Symptoms consistent with viral etiology at this time.  Supportive care recommendations were provided and discussed with the patient to include over-the-counter analgesics, fluids, rest, warm salt water gargles, and increasing her intake of zinc and vitamin C.  Discussed indications with patient regarding follow-up.  Patient was in agreement with this plan of care and verbalizes understanding.  All questions were answered.  Patient stable for discharge.  Final Clinical Impressions(s) / UC Diagnoses   Final diagnoses:  Sore throat  Left ear pain     Discharge Instructions      The rapid strep test was negative.  A throat culture is pending.  You will be contacted if the pending test result is abnormal.  You will also have access to the results via MyChart. Increase fluids and allow for plenty of rest. You may take over-the-counter Tylenol or ibuprofen as needed for pain, fever, or general discomfort. You may also use warm salt water gargles 3-4 times daily as needed or Chloraseptic throat spray or throat lozenges while symptoms persist. Also recommend the use of over-the-counter antihistamine and nasal spray to help with postnasal drainage such as Zyrtec, Claritin, or Allegra.  You may also purchase over-the-counter Flonase nasal spray as needed. Continue to monitor for worsening symptoms.  Follow-up if you develop fever, chills, or other upper respiratory  symptoms. Follow-up as needed.     ED Prescriptions   None    PDMP not reviewed this encounter.    [1]  Social History Tobacco Use   Smoking status: Never   Smokeless tobacco: Never  Vaping Use   Vaping status: Never Used  Substance Use Topics   Alcohol use: Yes    Comment: occasional   Drug use: Not Currently     Gilmer Etta PARAS, NP 05/08/24 1847  "

## 2024-05-08 NOTE — ED Triage Notes (Signed)
 Left ear pain and sore throat since Monday.

## 2024-05-09 ENCOUNTER — Other Ambulatory Visit (HOSPITAL_COMMUNITY): Payer: Self-pay | Admitting: Internal Medicine

## 2024-05-09 DIAGNOSIS — Z1231 Encounter for screening mammogram for malignant neoplasm of breast: Secondary | ICD-10-CM

## 2024-05-11 LAB — CULTURE, GROUP A STREP (THRC)

## 2024-05-12 ENCOUNTER — Ambulatory Visit (HOSPITAL_COMMUNITY): Payer: Self-pay

## 2024-05-19 ENCOUNTER — Encounter: Payer: Self-pay | Admitting: Women's Health

## 2024-05-19 ENCOUNTER — Ambulatory Visit: Payer: PRIVATE HEALTH INSURANCE | Admitting: Women's Health

## 2024-05-19 ENCOUNTER — Other Ambulatory Visit (HOSPITAL_COMMUNITY): Admission: RE | Admit: 2024-05-19 | Discharge: 2024-05-19 | Disposition: A | Source: Ambulatory Visit

## 2024-05-19 VITALS — BP 124/83 | HR 101 | Ht 64.0 in | Wt 232.0 lb

## 2024-05-19 DIAGNOSIS — N870 Mild cervical dysplasia: Secondary | ICD-10-CM | POA: Diagnosis not present

## 2024-05-19 DIAGNOSIS — R87622 Low grade squamous intraepithelial lesion on cytologic smear of vagina (LGSIL): Secondary | ICD-10-CM | POA: Diagnosis present

## 2024-05-19 DIAGNOSIS — R8789 Other abnormal findings in specimens from female genital organs: Secondary | ICD-10-CM

## 2024-05-19 DIAGNOSIS — R87619 Unspecified abnormal cytological findings in specimens from cervix uteri: Secondary | ICD-10-CM | POA: Insufficient documentation

## 2024-05-19 NOTE — Progress Notes (Signed)
" ° °  COLPOSCOPY PROCEDURE NOTE Patient name: Carla Bailey MRN [id number]  Date of birth: 1981/03/04 Subjective Findings:   Carla Bailey is a 44 y.o. G0P0000 Caucasian female being seen today for a colposcopy. Indication: Abnormal pap on 01/18/24: LSIL w/ HRHPV positive: other (not 16, 18/45)  Prior cytology: H/O LEEP 43yrs ago Date Result Procedure  2024 LSIL w/ HRHPV positive: type not specified Colposcopy: 11/07/22 nl, no bx  Patient's last menstrual period was 05/06/2024 (approximate). Contraception: NuvaRing vaginal inserts. Menopausal: no. Hysterectomy: no.   Smoker: no. Immunocompromised: no.   The risks and benefits were explained and informed consent was obtained, and written copy is in chart. Pertinent History Reviewed:   Reviewed past medical,surgical, social, obstetrical and family history.  Reviewed problem list, medications and allergies. Objective Findings & Procedure:   Vitals:   05/19/24 0940  BP: 124/83  Pulse: (!) 101  Weight: 232 lb (105.2 kg)  Height: 5' 4 (1.626 m)  Body mass index is 39.82 kg/m.  No results found for this or any previous visit (from the past 24 hours).   Time out was performed.  Speculum placed in the vagina, cervix fully visualized. SCJ: not fully visualized. Cervix swabbed x 3 with acetic acid-began bleeding moderately, states happened w/ pap.   Polyp became visible at os, removed long stringy polyp w/ ring forceps Acetowhitening present: No Cervix: no visible lesions, no mosaicism, no punctation, and no abnormal vasculature. Endocervical curettage performed, Random cervical biopsy taken at 6 o'clock, and Hemostasis achieved with Monsel's solution. Vagina: vaginal colposcopy not performed Vulva: vulvar colposcopy not performed  Specimens: 3  Complications: none  Chaperone: Aleck Blase  Colposcopic Impression & Plan:   Colpo: SCJ inadequately seen Cervical polyp removed Plan: Post biopsy instructions given, Will notify  patient of results when back, and Will base plan of care on pathology results and ASCCP guidelines  Return for as needed, plans to repeat pap w/ PCP in Sept.  Suzen JONELLE Fetters CNM, [facility] 05/19/2024 10:39 AM  "

## 2024-05-19 NOTE — Patient Instructions (Signed)
 Colposcopy, Care After  The following information offers guidance on how to care for yourself after your procedure. Your health care provider may also give you more specific instructions. If you have problems or questions, contact your health care provider. What can I expect after the procedure? If you had a colposcopy without a biopsy, you can expect to feel fine right away after your procedure. However, you may have some spotting of blood for a few days. You can return to your normal activities. If you had a colposcopy with a biopsy, it is common after the procedure to have: Soreness and mild pain. These may last for a few days. Mild vaginal bleeding or discharge that is dark-colored and grainy. This may last for a few days. The discharge may be caused by a liquid (solution) that was used during the procedure. You may need to wear a sanitary pad during this time. Spotting of blood for at least 48 hours after the procedure. Follow these instructions at home: Medicines Take over-the-counter and prescription medicines only as told by your health care provider. Talk with your health care provider about what type of over-the-counter pain medicines and prescription medicines you can start to take again. It is especially important to talk with your health care provider if you take blood thinners. Activity Avoid using douche products, using tampons, and having sex for at least 3 days after the procedure or for as long as told by your health care provider. Return to your normal activities as told by your health care provider. Ask your health care provider what activities are safe for you. General instructions Ask your health care provider if you may take baths, swim, or use a hot tub. You may take showers. If you use birth control (contraception), continue to use it. Keep all follow-up visits. This is important. Contact a health care provider if: You have a fever or chills. You faint or feel  light-headed. Get help right away if: You have heavy bleeding from your vagina or pass blood clots. Heavy bleeding is bleeding that soaks through a sanitary pad in less than 1 hour. You have vaginal discharge that is abnormal, is yellow in color, or smells bad. This could be a sign of infection. You have severe pain or cramps in your lower abdomen that do not go away with medicine. Summary If you had a colposcopy without a biopsy, you can expect to feel fine right away, but you may have some spotting of blood for a few days. You can return to your normal activities. If you had a colposcopy with a biopsy, it is common to have mild pain for a few days and spotting for 48 hours after the procedure. Avoid using douche products, using tampons, and having sex for at least 3 days after the procedure or for as long as told by your health care provider. Get help right away if you have heavy bleeding, severe pain, or signs of infection. This information is not intended to replace advice given to you by your health care provider. Make sure you discuss any questions you have with your health care provider. Document Revised: 09/12/2020 Document Reviewed: 09/12/2020 Elsevier Patient Education  2024 [employer].

## 2024-05-22 ENCOUNTER — Ambulatory Visit: Payer: Self-pay | Admitting: Women's Health

## 2024-05-22 LAB — SURGICAL PATHOLOGY

## 2024-06-20 ENCOUNTER — Ambulatory Visit (HOSPITAL_COMMUNITY): Payer: PRIVATE HEALTH INSURANCE
